# Patient Record
Sex: Male | Born: 1955 | Race: White | Hispanic: No | Marital: Married | Smoking: Never smoker
Health system: Southern US, Community
[De-identification: ages and names within clinical notes are randomized; demographics above are authoritative.]

---

## 2005-10-08 ENCOUNTER — Ambulatory Visit: Payer: Self-pay | Admitting: Gastroenterology

## 2005-10-17 ENCOUNTER — Ambulatory Visit: Payer: Self-pay | Admitting: Gastroenterology

## 2005-10-18 ENCOUNTER — Ambulatory Visit: Payer: Self-pay | Admitting: Gastroenterology

## 2005-11-19 ENCOUNTER — Ambulatory Visit: Payer: Self-pay | Admitting: Gastroenterology

## 2008-05-24 ENCOUNTER — Ambulatory Visit: Payer: Self-pay | Admitting: Family Medicine

## 2008-06-14 ENCOUNTER — Ambulatory Visit: Payer: Self-pay | Admitting: Urology

## 2008-06-20 ENCOUNTER — Inpatient Hospital Stay: Payer: Self-pay | Admitting: Urology

## 2008-06-29 ENCOUNTER — Ambulatory Visit: Payer: Self-pay | Admitting: Urology

## 2008-09-21 ENCOUNTER — Ambulatory Visit: Payer: Self-pay | Admitting: Urology

## 2009-05-03 ENCOUNTER — Ambulatory Visit: Payer: Self-pay | Admitting: Surgery

## 2009-05-08 ENCOUNTER — Ambulatory Visit: Payer: Self-pay | Admitting: Surgery

## 2009-10-16 IMAGING — CT CT ABDOMEN AND PELVIS WITHOUT AND WITH CONTRAST
2 of 4 series · 14 of 32 positions shown, 19 images · non-contrast
Comparison: none

REASON FOR EXAM: Renal Cell Carcinoma 2 Mo Follow Up  Post Radical
Nephrectomy
COMMENTS:

[Series 2: wo · axial · 0.90mm/px · z∈[+58,+478]mm · 8 of 109 slices shown, 13 images]
[im 13/109  soft-tissue]
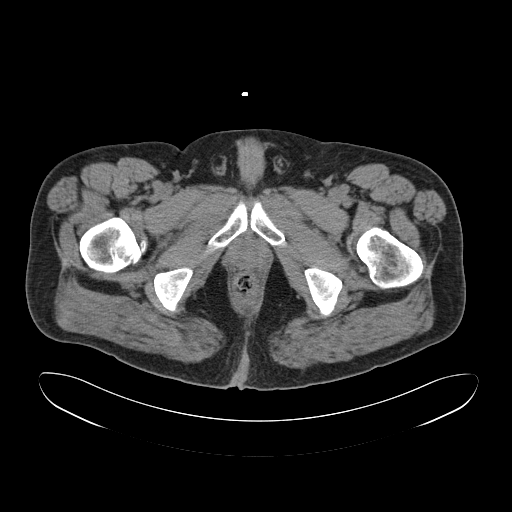
[im 13/109  bone]
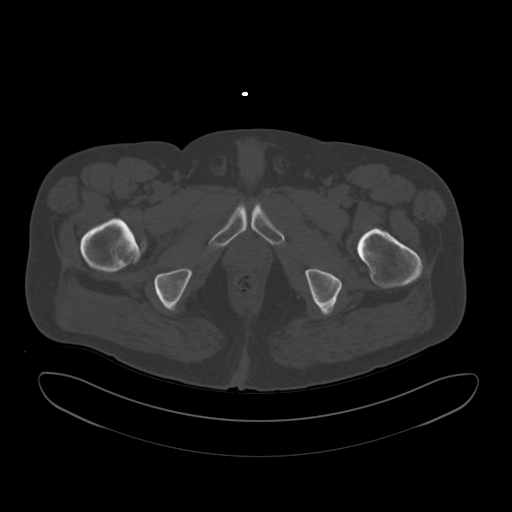
[im 25/109  soft-tissue]
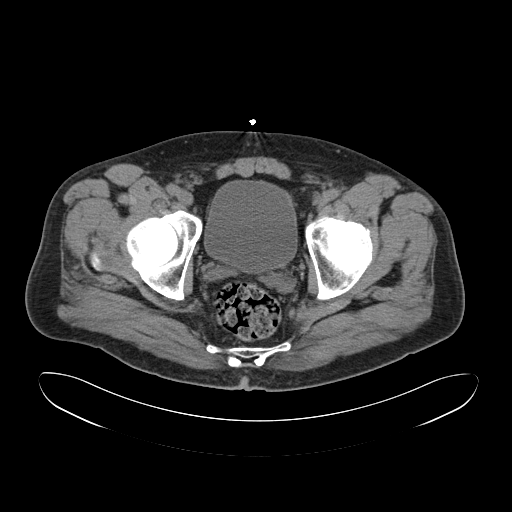
[im 37/109  soft-tissue]
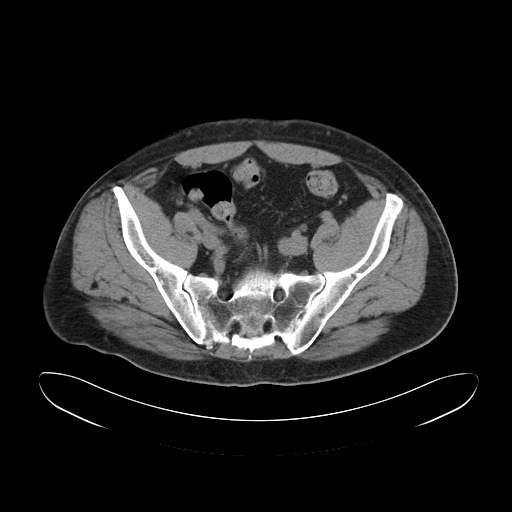
[im 49/109  soft-tissue]
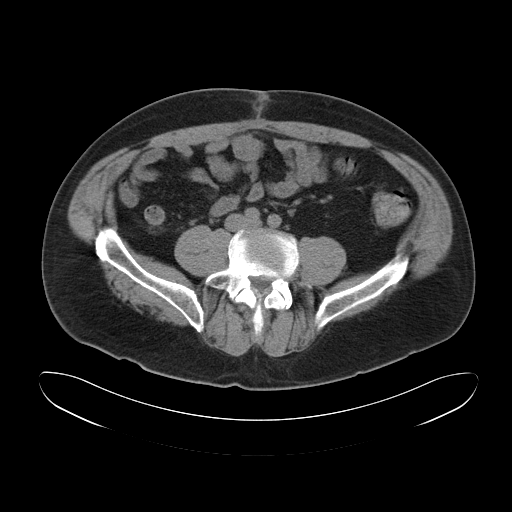
[im 61/109  soft-tissue]
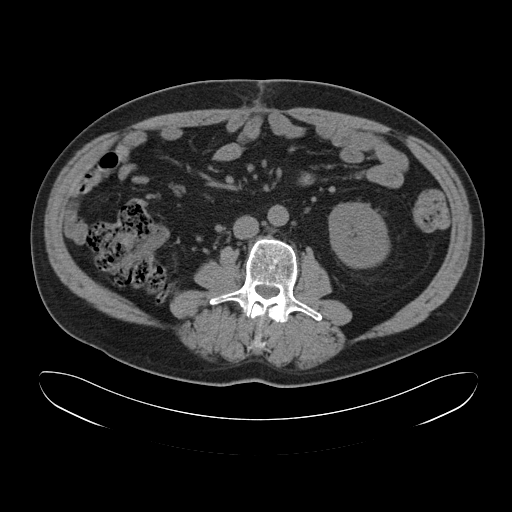
[im 61/109  lung]
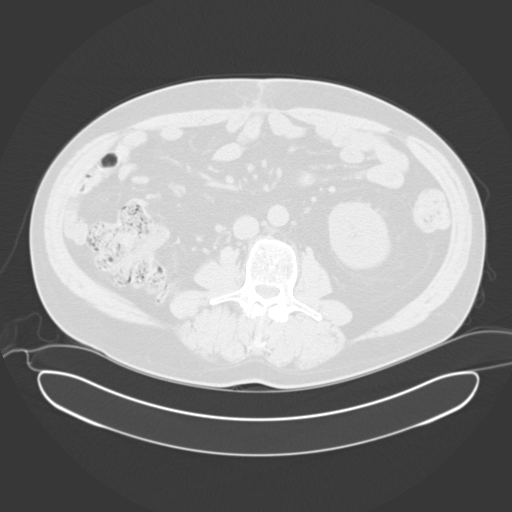
[im 73/109  soft-tissue]
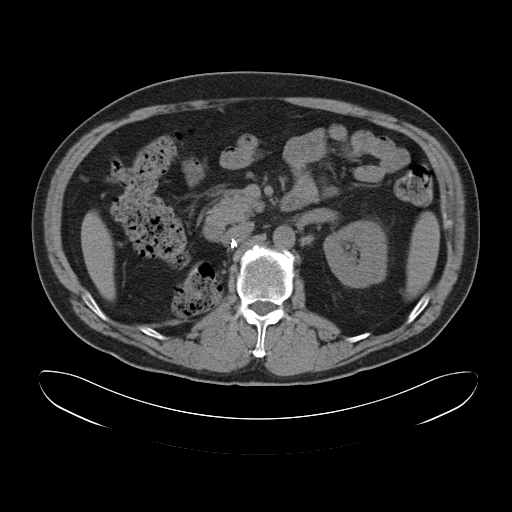
[im 73/109  lung]
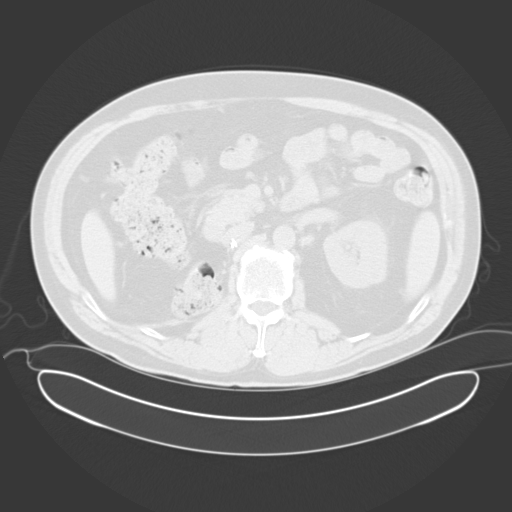
[im 85/109  soft-tissue]
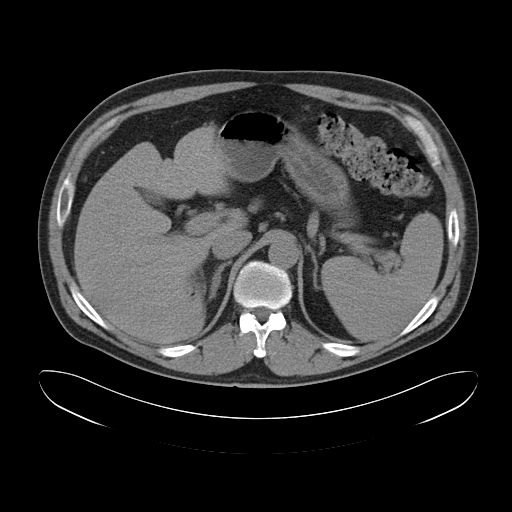
[im 85/109  lung]
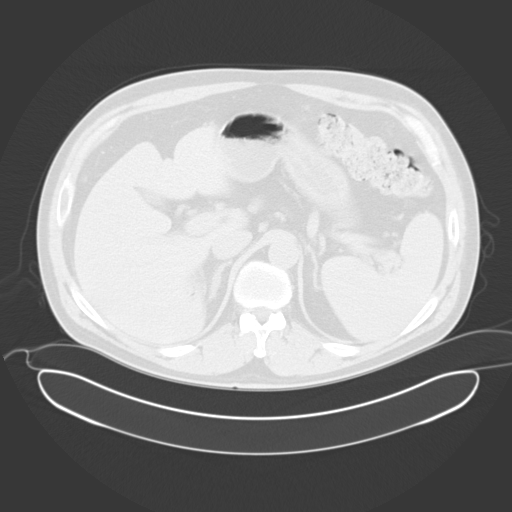
[im 97/109  soft-tissue]
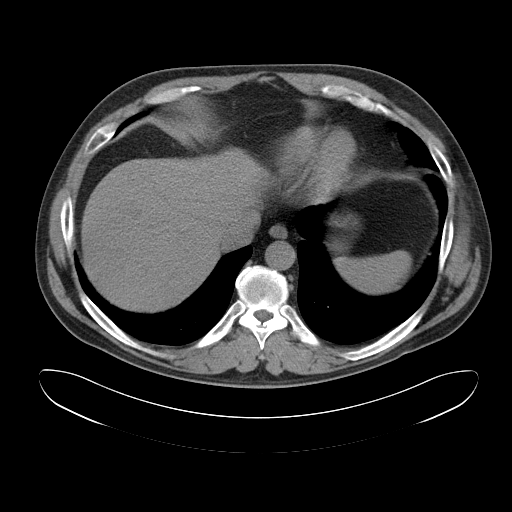
[im 97/109  lung]
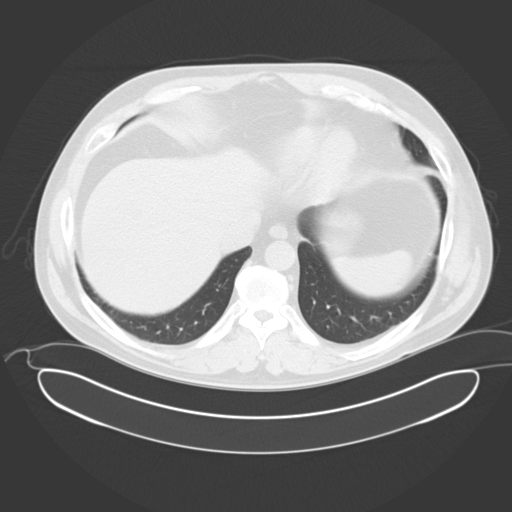

[Series 3: with · axial · 0.90mm/px · z∈[+58,+358]mm · 6 of 109 slices shown]
[im 13/109  soft-tissue]
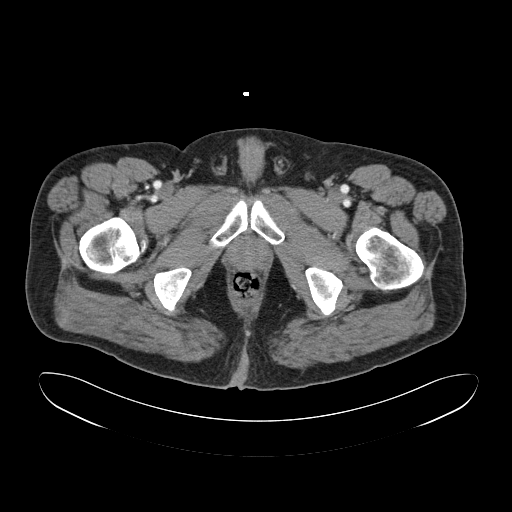
[im 25/109  soft-tissue]
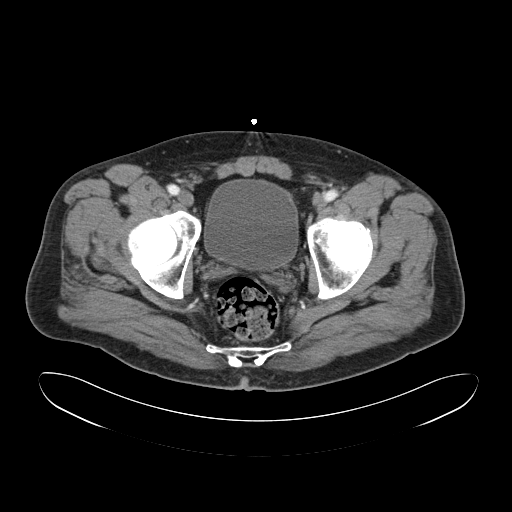
[im 37/109  soft-tissue]
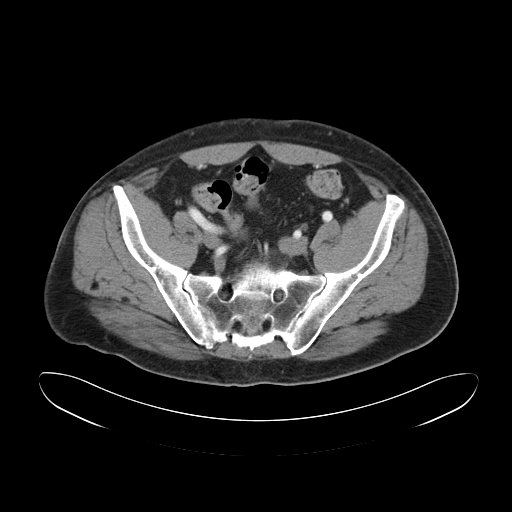
[im 49/109  soft-tissue]
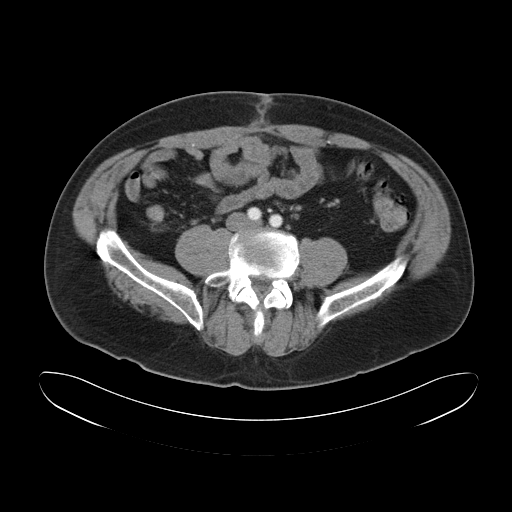
[im 61/109  soft-tissue]
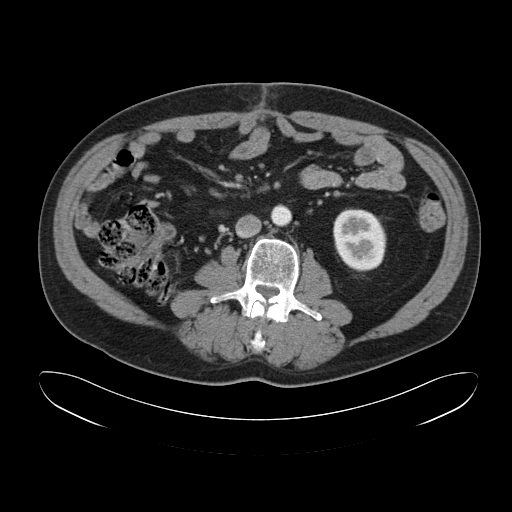
[im 73/109  soft-tissue]
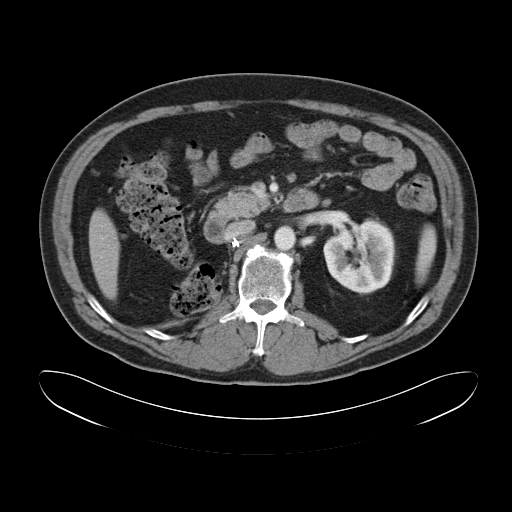

[14 of 32 positions shown; findings below may reference images not displayed]

PROCEDURE:     CT  - CT ABDOMEN / PELVIS  W/WO  - September 21, 2008  [DATE]

RESULT:     Triphasic CT of the abdomen and pelvis is performed. The patient
received an injection of 80 ml of Asovue-1V6 for the contrast portion of the
exam. The patient is status post right nephrectomy. Images are reconstructed
in the axial plane at 5 mm slice thickness.
FINDINGS: Images through the base of the lungs demonstrate thickening of the
major fissure laterally on the right. There is no definite pulmonary mass or
nodule. Noncontrast images demonstrate no evidence of a left renal stone. No
radiopaque gallstones are evident.

Following contrast administration, the liver, spleen, pancreas, left kidney,
adrenal glands and aorta enhance normally. There is no retroperitoneal mass
or adenopathy. There is no ascites. The urinary bladder is unremarkable.
There is no abnormal bowel distention.

The delayed images show excretion of contrast opacified urine by the left
kidney. There is minimal low attenuation on image #37 along the posterior
lateral aspect of the kidney which is too small for accurate
characterization. Follow-up on subsequent studies is recommended. There is a
tiny, low attenuation area on image #35 in the upper pole which is
unchanged. The slightly exophytic, low attenuation area posterolaterally
mentioned above appears unchanged compared to images from the previous study
(image #34).
IMPRESSION: 1.  Low attenuation areas within the left kidney as described. These appear
to be unchanged compared to the study of 05/24/2008. Follow-up on subsequent
exams is suggested.
2.  The patient is status post right nephrectomy.
3.  There is no evidence of metastatic disease.

## 2010-09-21 ENCOUNTER — Ambulatory Visit: Payer: Self-pay | Admitting: Gastroenterology

## 2013-01-18 ENCOUNTER — Other Ambulatory Visit: Payer: Self-pay | Admitting: *Deleted

## 2013-08-19 ENCOUNTER — Ambulatory Visit: Payer: Self-pay | Admitting: Surgery

## 2013-08-26 ENCOUNTER — Ambulatory Visit: Payer: Self-pay | Admitting: Surgery

## 2013-08-30 LAB — PATHOLOGY REPORT

## 2014-03-09 ENCOUNTER — Emergency Department: Payer: Self-pay | Admitting: Student

## 2014-03-09 LAB — PROTIME-INR
INR: 1
PROTHROMBIN TIME: 12.8 s (ref 11.5–14.7)

## 2014-03-09 LAB — COMPREHENSIVE METABOLIC PANEL
ALK PHOS: 73 U/L
ALT: 27 U/L
ANION GAP: 6 — AB (ref 7–16)
AST: 21 U/L (ref 15–37)
Albumin: 4.1 g/dL (ref 3.4–5.0)
BILIRUBIN TOTAL: 1.4 mg/dL — AB (ref 0.2–1.0)
BUN: 17 mg/dL (ref 7–18)
CO2: 27 mmol/L (ref 21–32)
CREATININE: 1.2 mg/dL (ref 0.60–1.30)
Calcium, Total: 8.9 mg/dL (ref 8.5–10.1)
Chloride: 107 mmol/L (ref 98–107)
EGFR (African American): >60
EGFR (Non-African Amer.): >60
Glucose: 112 mg/dL — ABNORMAL HIGH (ref 65–99)
Osmolality: 282 (ref 275–301)
Potassium: 4 mmol/L (ref 3.5–5.1)
Sodium: 140 mmol/L (ref 136–145)
TOTAL PROTEIN: 7.3 g/dL (ref 6.4–8.2)

## 2014-03-09 LAB — TROPONIN I: Troponin-I: <0.02 ng/mL

## 2014-03-09 LAB — CBC
HCT: 50 % (ref 40.0–52.0)
HGB: 16.7 g/dL (ref 13.0–18.0)
MCH: 27.7 pg (ref 26.0–34.0)
MCHC: 33.3 g/dL (ref 32.0–36.0)
MCV: 83 fL (ref 80–100)
Platelet: 174 10*3/uL (ref 150–440)
RBC: 6.01 10*6/uL — ABNORMAL HIGH (ref 4.40–5.90)
RDW: 13.3 % (ref 11.5–14.5)
WBC: 6.5 10*3/uL (ref 3.8–10.6)

## 2014-03-09 LAB — APTT: ACTIVATED PTT: 30.2 s (ref 23.6–35.9)

## 2014-03-09 LAB — CK TOTAL AND CKMB (NOT AT ARMC)
CK, Total: 114 U/L (ref 39–308)
CK-MB: 1.9 ng/mL (ref 0.5–3.6)

## 2014-06-18 NOTE — Op Note (Signed)
PATIENT NAME:  Jamie Arroyo, [name] MR#:  [id number] DATE OF BIRTH:  Mar 05, 1955  DATE OF PROCEDURE:  08/26/2013  PREOPERATIVE DIAGNOSIS: Chronically incarcerated ventral hernia.  POSTOPERATIVE DIAGNOSIS: Chronically incarcerated ventral hernia.  PROCEDURE: Ventral hernia repair.   SURGEON: Renda RollsWilton Shandora Koogler, MD  ANESTHESIA: General.   INDICATIONS: This 59 year old male has a history of laparoscopic nephrectomy. He has recently developed a painful mass in the left upper quadrant just about an inch lateral to the midline, about 2 inches cephalad to the umbilicus. He has developed a mass which was demonstrated on physical exam consistent with a chronically incarcerated ventral hernia and repair was recommended for definitive treatment.   DESCRIPTION OF PROCEDURE: The patient was placed on the operating table in the supine position under general anesthesia. The abdomen was clipped and prepared with ChloraPrep and draped in a sterile manner.   A transversely oriented approximately 5 cm incision was made in the left upper quadrant which extended as far as the midline medially. It was just above a scar related to a laparoscopic port site. This incision was carried down through subcutaneous tissues, electrocautery was used for hemostasis, and demonstrated a hard mass which was some 3 cm in dimension. It was dissected free from surrounding structures and dissected away from a fascial ring defect. The mass was hard and appeared to be most likely already infarcted. The mass was amputated dividing it at the fascial defect with electrocautery. There was no intestine in this mass, but just necrotic fatty tissue. Next, the fascial ring defect was examined and properitoneal fat was dissected away extending back some 7 to 8 mm in all directions. Next, a Bard soft mesh was cut to create an oval shape of some 1.5 x 2 cm and was placed in the properitoneal plane, oriented transversely. This was sutured to the overlying fascia  with through and through 0 Surgilon sutures. Next, the repair was further carried out with a transversely oriented suture line of interrupted 0 Surgilon figure-of-eight sutures incorporating mesh into each suture. The repair looked good. Hemostasis was intact. Tissues around the fascial repair were infiltrated with 0.5% Sensorcaine with epinephrine. Subcutaneous tissues were infiltrated as well using a total of 15 mL. Next, the skin was closed with a running 4-0 Monocryl subcuticular suture and Dermabond. The patient tolerated surgery satisfactorily and was then prepared for transfer to the recovery room.    ____________________________ Jamie CommonsJ. Renda RollsWilton Rye Dorado, MD [email or web address] D: 08/26/2013 08:35:13 ET T: 08/26/2013 11:28:26 ET JOB#: [id number]  cc: Adella HareJ. Wilton Shaquon Gropp, MD, <Dictator> Adella HareWILTON J Alli Jasmer MD ELECTRONICALLY SIGNED 09/06/2013 13:01

## 2015-01-11 ENCOUNTER — Other Ambulatory Visit: Payer: Self-pay | Admitting: Family Medicine

## 2015-01-11 DIAGNOSIS — M5432 Sciatica, left side: Secondary | ICD-10-CM

## 2015-01-27 ENCOUNTER — Other Ambulatory Visit: Payer: Self-pay

## 2015-04-03 IMAGING — CR DG CHEST 1V PORT
1 series · 1 of 1 positions shown · non-contrast
Comparison: 06/14/2008

CLINICAL DATA: Midsternal chest pain beginning this morning.
Weakness. Some shortness of breath.

EXAM:
PORTABLE CHEST - 1 VIEW

[ap]
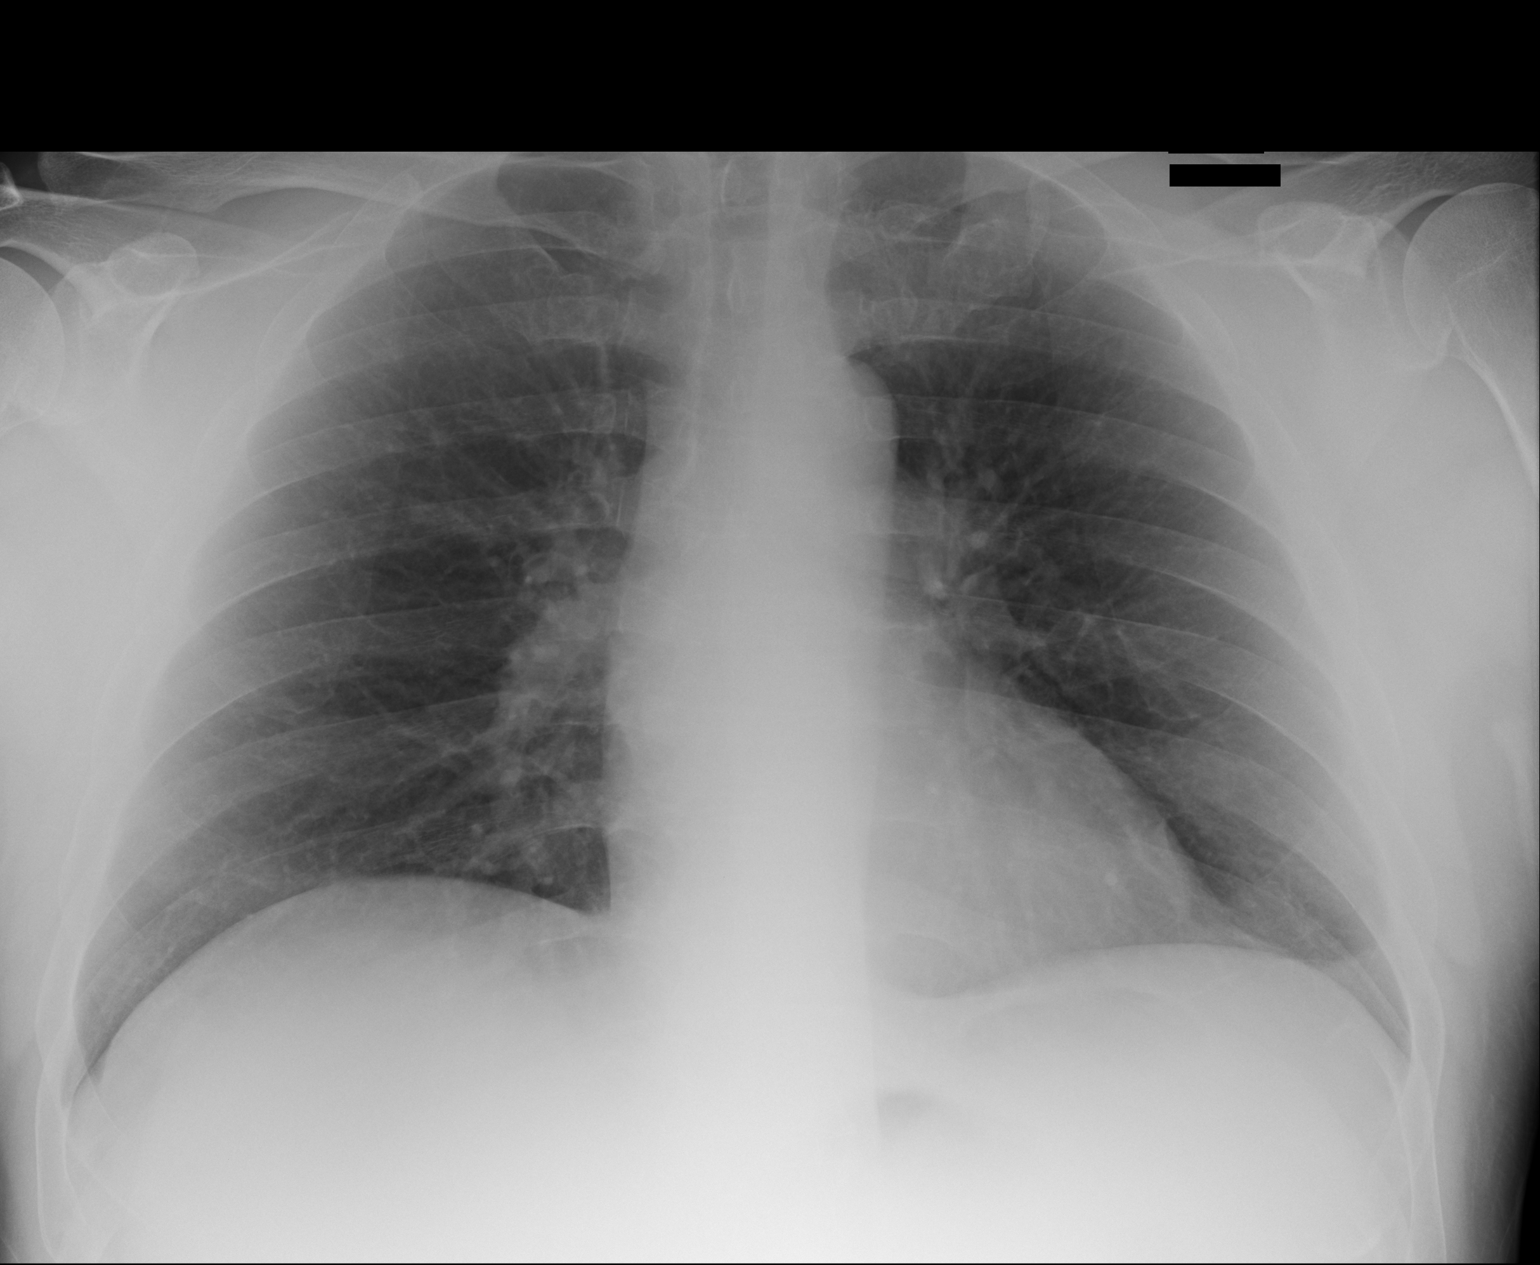

[1 of 1 positions shown; findings below may reference images not displayed]

FINDINGS: The cardiomediastinal silhouette is within normal limits. The lungs
are well inflated and clear. There is no evidence of pleural
effusion or pneumothorax. No acute osseous abnormality is
identified.
IMPRESSION: No active disease.

## 2017-03-06 DIAGNOSIS — I10 Essential (primary) hypertension: Secondary | ICD-10-CM | POA: Diagnosis not present

## 2017-03-13 DIAGNOSIS — I10 Essential (primary) hypertension: Secondary | ICD-10-CM | POA: Diagnosis not present

## 2017-03-13 DIAGNOSIS — N289 Disorder of kidney and ureter, unspecified: Secondary | ICD-10-CM | POA: Diagnosis not present

## 2017-09-09 DIAGNOSIS — Z0000 Encounter for general adult medical examination without abnormal findings: Secondary | ICD-10-CM | POA: Diagnosis not present

## 2017-09-16 DIAGNOSIS — Z0000 Encounter for general adult medical examination without abnormal findings: Secondary | ICD-10-CM | POA: Diagnosis not present

## 2017-09-16 DIAGNOSIS — I10 Essential (primary) hypertension: Secondary | ICD-10-CM | POA: Diagnosis not present

## 2018-03-12 DIAGNOSIS — I10 Essential (primary) hypertension: Secondary | ICD-10-CM | POA: Diagnosis not present

## 2018-03-19 DIAGNOSIS — E669 Obesity, unspecified: Secondary | ICD-10-CM | POA: Diagnosis not present

## 2018-03-19 DIAGNOSIS — I10 Essential (primary) hypertension: Secondary | ICD-10-CM | POA: Diagnosis not present

## 2019-07-05 ENCOUNTER — Other Ambulatory Visit: Payer: Self-pay

## 2019-07-05 ENCOUNTER — Ambulatory Visit: Payer: BC Managed Care – PPO | Admitting: Podiatry

## 2019-07-05 ENCOUNTER — Encounter: Payer: Self-pay | Admitting: Podiatry

## 2019-07-05 DIAGNOSIS — Q828 Other specified congenital malformations of skin: Secondary | ICD-10-CM

## 2019-07-06 ENCOUNTER — Ambulatory Visit: Payer: Self-pay | Admitting: Podiatry

## 2019-07-06 NOTE — Progress Notes (Signed)
This patient presents to the office with a painful skin lesion on his left forefoot.  He says this is the same spot a nails entered his foot.  He says the nail incident happened about 1 year ago.  He says this site is aggravated by working on concrete.   He was seen by Dr.  Alberteen Spindle at [place] three months ago.   He had the skin lesion trimmed which temporarily helped.   He presents to the office for evaluation and treatment today.  Vascular  Dorsalis pedis and posterior tibial pulses are palpable  B/L.  Capillary return  WNL.  Temperature gradient is  WNL.  Skin turgor  WNL  Sensorium  Senn Weinstein monofilament wire  WNL. Normal tactile sensation.  Nail Exam  Patient has normal nails with no evidence of bacterial or fungal infection.  Orthopedic  Exam  Muscle tone and muscle strength  WNL.  No limitations of motion feet  B/L.  No crepitus or joint effusion noted.  Foot type is unremarkable and digits show no abnormalities.  Plantar flexed second metatarsal left foot.  Skin  No open lesions.  Normal skin texture and turgor. Porokeratosis sub 2 left foot.   Porokeratosis sub 2 left foot.  IE.  Debride porokeratosis.  Discussed this condition with this patient.  Suggested he wear gel cutouts around lesion or pick up spenco insoles for additional padding.  This lesion may not be the result of the nail penetration but only porokeratotic formation.  RTC prn.     Helane Gunther DPM
